# Patient Record
Sex: Male | Born: 1990 | Race: Black or African American | Hispanic: No | Marital: Single | Smoking: Former smoker
Health system: Southern US, Community
[De-identification: ages and names within clinical notes are randomized; demographics above are authoritative.]

## PROBLEM LIST (undated history)

## (undated) DIAGNOSIS — Z789 Other specified health status: Secondary | ICD-10-CM

## (undated) HISTORY — DX: Other specified health status: Z78.9

---

## 2016-09-30 ENCOUNTER — Encounter (HOSPITAL_BASED_OUTPATIENT_CLINIC_OR_DEPARTMENT_OTHER): Payer: Self-pay

## 2016-09-30 ENCOUNTER — Emergency Department (HOSPITAL_BASED_OUTPATIENT_CLINIC_OR_DEPARTMENT_OTHER)
Admission: EM | Admit: 2016-09-30 | Discharge: 2016-09-30 | Disposition: A | Discharge status: Home / Self Care | Payer: BLUE CROSS/BLUE SHIELD | Attending: Emergency Medicine | Admitting: Emergency Medicine

## 2016-09-30 DIAGNOSIS — R590 Localized enlarged lymph nodes: Secondary | ICD-10-CM | POA: Insufficient documentation

## 2016-09-30 DIAGNOSIS — R2243 Localized swelling, mass and lump, lower limb, bilateral: Secondary | ICD-10-CM | POA: Diagnosis present

## 2016-09-30 LAB — CBC WITH DIFFERENTIAL/PLATELET
Basophils Absolute: 0 10*3/uL (ref 0.0–0.1)
Basophils Relative: 0 %
EOS ABS: 0.7 10*3/uL (ref 0.0–0.7)
EOS PCT: 6 %
HCT: 41.3 % (ref 39.0–52.0)
HEMOGLOBIN: 14.4 g/dL (ref 13.0–17.0)
Lymphocytes Relative: 28 %
Lymphs Abs: 3 10*3/uL (ref 0.7–4.0)
MCH: 31.1 pg (ref 26.0–34.0)
MCHC: 34.9 g/dL (ref 30.0–36.0)
MCV: 89.2 fL (ref 78.0–100.0)
MONO ABS: 0.9 10*3/uL (ref 0.1–1.0)
Monocytes Relative: 8 %
NEUTROS ABS: 6.2 10*3/uL (ref 1.7–7.7)
Neutrophils Relative %: 58 %
Platelets: 303 10*3/uL (ref 150–400)
RBC: 4.63 MIL/uL (ref 4.22–5.81)
RDW: 12.4 % (ref 11.5–15.5)
WBC: 10.8 10*3/uL — ABNORMAL HIGH (ref 4.0–10.5)

## 2016-09-30 LAB — URINALYSIS, MICROSCOPIC (REFLEX)
RBC / HPF: NONE SEEN RBC/hpf (ref 0–5)
WBC, UA: NONE SEEN WBC/hpf (ref 0–5)

## 2016-09-30 LAB — URINALYSIS, ROUTINE W REFLEX MICROSCOPIC
Bilirubin Urine: NEGATIVE
GLUCOSE, UA: NEGATIVE mg/dL
Hgb urine dipstick: NEGATIVE
Ketones, ur: NEGATIVE mg/dL
LEUKOCYTES UA: NEGATIVE
Nitrite: NEGATIVE
PH: 8 (ref 5.0–8.0)
PROTEIN: 30 mg/dL — AB
Specific Gravity, Urine: 1.015 (ref 1.005–1.030)

## 2016-09-30 NOTE — ED Provider Notes (Signed)
[facility] Provider Note   CSN: [id number] Arrival date & time: 09/30/16  1929     History   Chief Complaint Chief Complaint  Patient presents with  . Groin Swelling    HPI Caydn Knightly is a 26 y.o. male.  The history is provided by the patient.   26 year old male with no pertinent past medical history presents the emergency department with 2 days of bilateral inguinal swelling. He reports feeling multiple nodules. Have been persistent since onset. They were initially mildly tender however pain has resolved. No alleviating or aggravating factors. He denies any history of sexually transmitted diseases. States that he had a recent checkup several months ago that was negative for HIV, syphilis, gonorrhea, chlamydia. States that he's monogamous with 1 partner for 3 years. Denies any recent dysuria, penile discharge, testicular pain. He does not partake in anal intercourse. He denies any lower extremity injuries. No flulike illnesses that he can remember in the past several months.  Denies any other physical complaints.   History reviewed. No pertinent past medical history.  There are no active problems to display for this patient.   History reviewed. No pertinent surgical history.     Home Medications    Prior to Admission medications   Not on File    Family History No family history on file.  Social History Social History  Substance Use Topics  . Smoking status: Never Smoker  . Smokeless tobacco: Never Used  . Alcohol use Yes     Comment: weekly     Allergies   Patient has no known allergies.   Review of Systems Review of Systems All other systems are reviewed and are negative for acute change except as noted in the HPI   Physical Exam Updated Vital Signs BP 130/84 (BP Location: Right Arm)   Pulse 67   Temp 98.3 F (36.8 C) (Oral)   Resp 16   Ht  (1.753 m)   Wt 69.9 kg (154 lb)   SpO2 100%   BMI 22.74 kg/m   Physical Exam   Constitutional: He is oriented to person, place, and time. He appears well-developed and well-nourished. No distress.  HENT:  Head: Normocephalic and atraumatic.  Nose: Nose normal.  Eyes: Pupils are equal, round, and reactive to light. Conjunctivae and EOM are normal. Right eye exhibits no discharge. Left eye exhibits no discharge. No scleral icterus.  Neck: Normal range of motion. Neck supple.  Cardiovascular: Normal rate and regular rhythm.  Exam reveals no gallop and no friction rub.   No murmur heard. Pulmonary/Chest: Effort normal and breath sounds normal. No stridor. No respiratory distress. He has no rales.  Abdominal: Soft. He exhibits no distension. There is no tenderness.  Musculoskeletal: He exhibits no edema or tenderness.  Lymphadenopathy:       Head (right side): No submental, no submandibular, no preauricular, no posterior auricular and no occipital adenopathy present.       Head (left side): No submental, no submandibular, no preauricular, no posterior auricular and no occipital adenopathy present.    He has no cervical adenopathy.    He has no axillary adenopathy.       Right axillary: No pectoral and no lateral adenopathy present.       Left axillary: No pectoral and no lateral adenopathy present.      Right: Inguinal adenopathy present. No supraclavicular adenopathy present.       Left: Inguinal adenopathy present. No supraclavicular adenopathy present.  Neurological: He  is alert and oriented to person, place, and time.  Skin: Skin is warm and dry. No rash noted. He is not diaphoretic. No erythema.  Psychiatric: He has a normal mood and affect.  Vitals reviewed.    ED Treatments / Results  Labs (all labs ordered are listed, but only abnormal results are displayed) Labs Reviewed  URINALYSIS, ROUTINE W REFLEX MICROSCOPIC - Abnormal; Notable for the following:       Result Value   Protein, ur 30 (*)    All other components within normal limits  URINALYSIS,  MICROSCOPIC (REFLEX) - Abnormal; Notable for the following:    Bacteria, UA RARE (*)    Squamous Epithelial / LPF 0-5 (*)    All other components within normal limits  CBC WITH DIFFERENTIAL/PLATELET - Abnormal; Notable for the following:    WBC 10.8 (*)    All other components within normal limits  HIV ANTIBODY (ROUTINE TESTING)  RPR  GC/CHLAMYDIA PROBE AMP ([place]) NOT AT [facility]    EKG  EKG Interpretation None       Radiology No results found.  Procedures Procedures (including critical care time)  Medications Ordered in ED Medications - No data to display   Initial Impression / Assessment and Plan / ED Course  I have reviewed the triage vital signs and the nursing notes.  Pertinent labs & imaging results that were available during my care of the patient were reviewed by me and considered in my medical decision making (see chart for details).     Lymphadenopathy of unknown etiology. GC/chlamydia, Trichomonas, HIV, RPR sent. CBC without abnormal cell counts.  Recommend he establish care with a primary care provider.   The patient is safe for discharge with strict return precautions.   Final Clinical Impressions(s) / ED Diagnoses   Final diagnoses:  Inguinal lymphadenopathy   Disposition: Discharge  Condition: Good  I have discussed the results, Dx and Tx plan with the patient who expressed understanding and agree(s) with the plan. Discharge instructions discussed at great length. The patient was given strict return precautions who verbalized understanding of the instructions. No further questions at time of discharge.    New Prescriptions   No medications on file    Follow Up: Primary care provider  In 2 weeks If symptoms do not improve or  worsen      Rikki Smestad, Amadeo Garnet, MD 09/30/16 2330

## 2016-09-30 NOTE — ED Triage Notes (Signed)
C/o "swollen glands" to right groin x 3 days-NAD-steady gait

## 2016-10-02 LAB — HIV ANTIBODY (ROUTINE TESTING W REFLEX): HIV SCREEN 4TH GENERATION: NONREACTIVE

## 2016-10-02 LAB — RPR: RPR: NONREACTIVE

## 2017-10-20 ENCOUNTER — Other Ambulatory Visit: Payer: Self-pay | Admitting: Gerontology

## 2017-10-20 ENCOUNTER — Ambulatory Visit (INDEPENDENT_AMBULATORY_CARE_PROVIDER_SITE_OTHER): Payer: Self-pay

## 2017-10-20 DIAGNOSIS — Z021 Encounter for pre-employment examination: Secondary | ICD-10-CM

## 2018-04-29 ENCOUNTER — Encounter (HOSPITAL_BASED_OUTPATIENT_CLINIC_OR_DEPARTMENT_OTHER): Payer: Self-pay | Admitting: Emergency Medicine

## 2018-04-29 ENCOUNTER — Other Ambulatory Visit: Payer: Self-pay

## 2018-04-29 ENCOUNTER — Emergency Department (HOSPITAL_BASED_OUTPATIENT_CLINIC_OR_DEPARTMENT_OTHER)
Admission: EM | Admit: 2018-04-29 | Discharge: 2018-04-29 | Disposition: A | Discharge status: Home / Self Care | Payer: BLUE CROSS/BLUE SHIELD | Attending: Emergency Medicine | Admitting: Emergency Medicine

## 2018-04-29 DIAGNOSIS — J029 Acute pharyngitis, unspecified: Secondary | ICD-10-CM

## 2018-04-29 DIAGNOSIS — J069 Acute upper respiratory infection, unspecified: Secondary | ICD-10-CM | POA: Diagnosis not present

## 2018-04-29 DIAGNOSIS — R070 Pain in throat: Secondary | ICD-10-CM | POA: Diagnosis present

## 2018-04-29 NOTE — ED Provider Notes (Signed)
Emergency Department Provider Note   I have reviewed the triage vital signs and the nursing notes.   HISTORY  Chief Complaint Sore Throat   HPI Alexander Callahan is a 28 y.o. male with no significant past medical history presents to the emergency department with sore throat and mild cough over the past several days.  Patient has not developed fever.  He is not having shaking chills or body aches.  No shortness of breath or chest pain. No known sick contacts.  He has been using over-the-counter medications and is feeling overall well.  He has been out of work for the last several days with symptoms.  He is requesting a note to return to work on Monday.  No radiation of symptoms or other modifying factors.  History reviewed. No pertinent past medical history.  There are no active problems to display for this patient.   History reviewed. No pertinent surgical history.  Allergies Patient has no known allergies.  No family history on file.  Social History Social History   Tobacco Use  . Smoking status: Never Smoker  . Smokeless tobacco: Never Used  Substance Use Topics  . Alcohol use: Yes    Comment: weekly  . Drug use: No    Review of Systems  Constitutional: No fever/chills Eyes: No visual changes. ENT: Positive sore throat. Cardiovascular: Denies chest pain. Respiratory: Denies shortness of breath. Positive cough.  Gastrointestinal: No abdominal pain.  No nausea, no vomiting.  No diarrhea.  No constipation. Genitourinary: Negative for dysuria. Musculoskeletal: Negative for back pain. Skin: Negative for rash. Neurological: Negative for headaches, focal weakness or numbness.  10-point ROS otherwise negative.  ____________________________________________   PHYSICAL EXAM:  VITAL SIGNS: ED Triage Vitals  Enc Vitals Group     BP 04/29/18 0719 (!) 148/83     Pulse Rate 04/29/18 0719 85     Resp 04/29/18 0719 16     Temp 04/29/18 0719 98.7 F (37.1 C)   Temp Source 04/29/18 0719 Oral     SpO2 04/29/18 0719 100 %     Weight 04/29/18 0719 145 lb (65.8 kg)     Height 04/29/18 0719 5\' 9"  (1.753 m)     Pain Score 04/29/18 0720 0   Constitutional: Alert and oriented. Well appearing and in no acute distress. Eyes: Conjunctivae are normal.  Head: Atraumatic. Nose: No congestion/rhinnorhea. Mouth/Throat: Mucous membranes are moist.  Oropharynx non-erythematous. No PTA. No tonsillar hypertrophy or exudate. Speaking in a clear voice.  Neck: No stridor.   Respiratory: Normal respiratory effort.   Gastrointestinal: No distention.  Musculoskeletal:  No gross deformities of extremities. Neurologic:  Normal speech and language.  Skin:  Skin is warm, dry and intact.  ____________________________________________   PROCEDURES  Procedure(s) performed:   Procedures  None  ____________________________________________   INITIAL IMPRESSION / ASSESSMENT AND PLAN / ED COURSE  Pertinent labs & imaging results that were available during my care of the patient were reviewed by me and considered in my medical decision making (see chart for details).   Patient presents to the emergency department for evaluation of sore throat and mild cough.  He has remained afebrile over the past several days.  He is requesting to return to work in 4 days.  Discussed that if he continues to have cough he will need to wear a mask while at work.  Also discussed that if he develops fever, temp > 100.4 F, and the next several days he will need to be off  of work for longer.  Discussed that he will need to remain fever free for 72 hours prior to returning to work.  Patient plans to purchase a thermometer today to monitor his temperature at home.  I did provide a work note saying that he could return to work on Monday under these conditions.  Discussed emergency department return precautions and self quarantine measures until he returns to work. Patient verbalizes understanding.     ____________________________________________  FINAL CLINICAL IMPRESSION(S) / ED DIAGNOSES  Final diagnoses:  Pharyngitis, unspecified etiology    Note:  This document was prepared using Dragon voice recognition software and may include unintentional dictation errors.  Alona Bene, MD Emergency Medicine    Long, Arlyss Repress, MD 04/29/18 [phone]

## 2018-04-29 NOTE — Discharge Instructions (Signed)

## 2018-04-29 NOTE — ED Triage Notes (Signed)
"  Scratchy throat" for a few days.  No other symptoms.  Has been out of work for a few days and needs a work note stating that he is OK to go back to work - ie. No Covid.

## 2018-04-29 NOTE — ED Notes (Signed)
ED Provider at bedside. 

## 2019-11-01 ENCOUNTER — Encounter (INDEPENDENT_AMBULATORY_CARE_PROVIDER_SITE_OTHER): Payer: Self-pay

## 2019-11-01 ENCOUNTER — Ambulatory Visit (INDEPENDENT_AMBULATORY_CARE_PROVIDER_SITE_OTHER): Payer: BC Managed Care – PPO | Admitting: Physician Assistant

## 2019-11-01 VITALS — BP 132/77 | HR 78 | Temp 97.8°F | Resp 16 | Ht 69.0 in | Wt 166.0 lb

## 2019-11-01 DIAGNOSIS — R102 Pelvic and perineal pain: Secondary | ICD-10-CM

## 2019-11-01 MED ORDER — CEFTRIAXONE SODIUM 500 MG IJ SOLR
500.0000 mg | Freq: Once | INTRAMUSCULAR | Status: AC
Start: 2019-11-01 — End: 2019-11-01
  Administered 2019-11-01: 17:00:00 500 mg via INTRAMUSCULAR

## 2019-11-01 MED ORDER — AZITHROMYCIN 500 MG PO TABS
1000.0000 mg | ORAL_TABLET | Freq: Once | ORAL | Status: AC
Start: 2019-11-01 — End: 2019-11-01
  Administered 2019-11-01: 17:00:00 1000 mg via ORAL
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
## 2019-11-01 NOTE — Progress Notes (Signed)
urine collected from patient by [facility]. This staff member prepared specimen, applied required charges and placed in courier box for lab courier to pick up.

## 2019-11-01 NOTE — Progress Notes (Signed)
-   MAR ACTION REPORT  (last 24 hrs)         Jacob Glover       Medication Name Action Time Action Site Route Rate Dose Reason Comments User     azithromycin (ZITHROMAX) tablet 1,000 mg 11/01/19 1727 Given  Oral  1,000 mg   [name]     cefTRIAXone (ROCEPHIN) injection 500 mg 11/01/19 1727 Given Right Ventrogluteal Intramuscular  500 mg   Chauncy Lean              Prepared medication. Reconstituted with lidocaine. Verified name and DOB. Explained procedure to patient and obtained verbal consent. Landmarks noted. Medication administered. Patient tolerated well. Chauncy Lean 5:29 PM

## 2019-11-01 NOTE — Patient Instructions (Signed)
If You Think You Have an STI (STD)  Treating a sexually transmitted infection (STI) early limits the problems it can cause and helps prevent its spread to others. An STI is also known as a sexually transmitted disease (STD). If you have an STI, get treated right away. Ask your partner to be tested, too. Then avoid sex until you’ve finished treatment and your healthcare provider says it’s OK to have sex again.   Follow your treatment plan  Treatment depends on the type of STI you have. Common treatments include injections and oral pills or liquids. Creams and gels can be applied to sores or warts caused by certain STIs. Follow the tips below:   · Get new treatment for each new STI.  · Don’t use old medicine, even for the same STI. Use medicines as directed.  · Don’t share medicine unless instructed to do so by your healthcare provider or clinic.  Talk to your partner  If you have an STI, it’s your duty to tell all your recent partners so they can be tested and treated. This is one important way to prevent the disease from being spread. Telling a partner that you have an STI can be hard. You may be embarrassed, angry, or afraid. It’s often unclear who had the STI first. So try not to place blame. Your healthcare provider may offer some advice on how to start.     Prevent future problems  Even after you’ve been treated, you can still be infected again. This is a common problem. It can happen if a partner passes the STI back to you. To prevent this, your partner or partners must be tested. He or she may also need treatment. After treatment, go to any scheduled follow-up visits. Then prevent future problems with safer sex. Limit your number of partners and always use a latex condom.   Diagnosing STIs  Your healthcare provider will take a health history and examine you. During your health history, you will be asked about your sex habits and health history. You may also be asked about drug use. Give honest answers.  Your healthcare provider will then check your body for signs of STIs. He or she also may do one or more of the following tests:   · Fluid may be swabbed from sores. Samples also may be taken from the vagina, penis, mouth, or rectum. The samples are then tested for STIs.  · Blood or urine samples may be taken. They are checked for viruses or bacteria that cause STIs.  · For women, cells from the cervix are checked for signs of cancer and the genital wart virus (human papillomavirus, or HPV). This is called a Pap smear, and is often now done along with HPV testing. If cell changes are found, or a high-risk type of HPV is found, a magnifying scope may be used to take a closer look (colposcopy). In men and women, a Pap smear may be done on the anus to check for HPV-linked cancer or precancerous changes. This is done by a healthcare provider gently swabbing cells from the lining of the anus, and then sending the sample to be looked at in the lab. If there are signs of abnormality, you may need more testing.  [employer] last reviewed this educational content on 02/13/2017    © 2000-2021 [employer]. All rights reserved. This information is not intended as a substitute for professional medical care. Always follow your healthcare professional's instructions.

## 2019-11-02 ENCOUNTER — Other Ambulatory Visit
Admission: RE | Admit: 2019-11-02 | Discharge: 2019-11-02 | Disposition: A | Discharge status: Home / Self Care | Payer: BC Managed Care – PPO | Source: Ambulatory Visit | Attending: Physician Assistant | Admitting: Physician Assistant

## 2019-11-02 DIAGNOSIS — R102 Pelvic and perineal pain: Secondary | ICD-10-CM

## 2019-11-02 LAB — VH STD AMPLIFIED DNA PROBE
Chlamydia trachomatis: NEGATIVE
Neisseria gonorrhoeae: NEGATIVE

## 2019-12-03 IMAGING — DX DG CHEST 1V
1 series · 1 of 1 positions shown · non-contrast
Comparison: None.

CLINICAL DATA: Pre-employment physical examination

EXAM:
CHEST  1 VIEW

[chest pa]
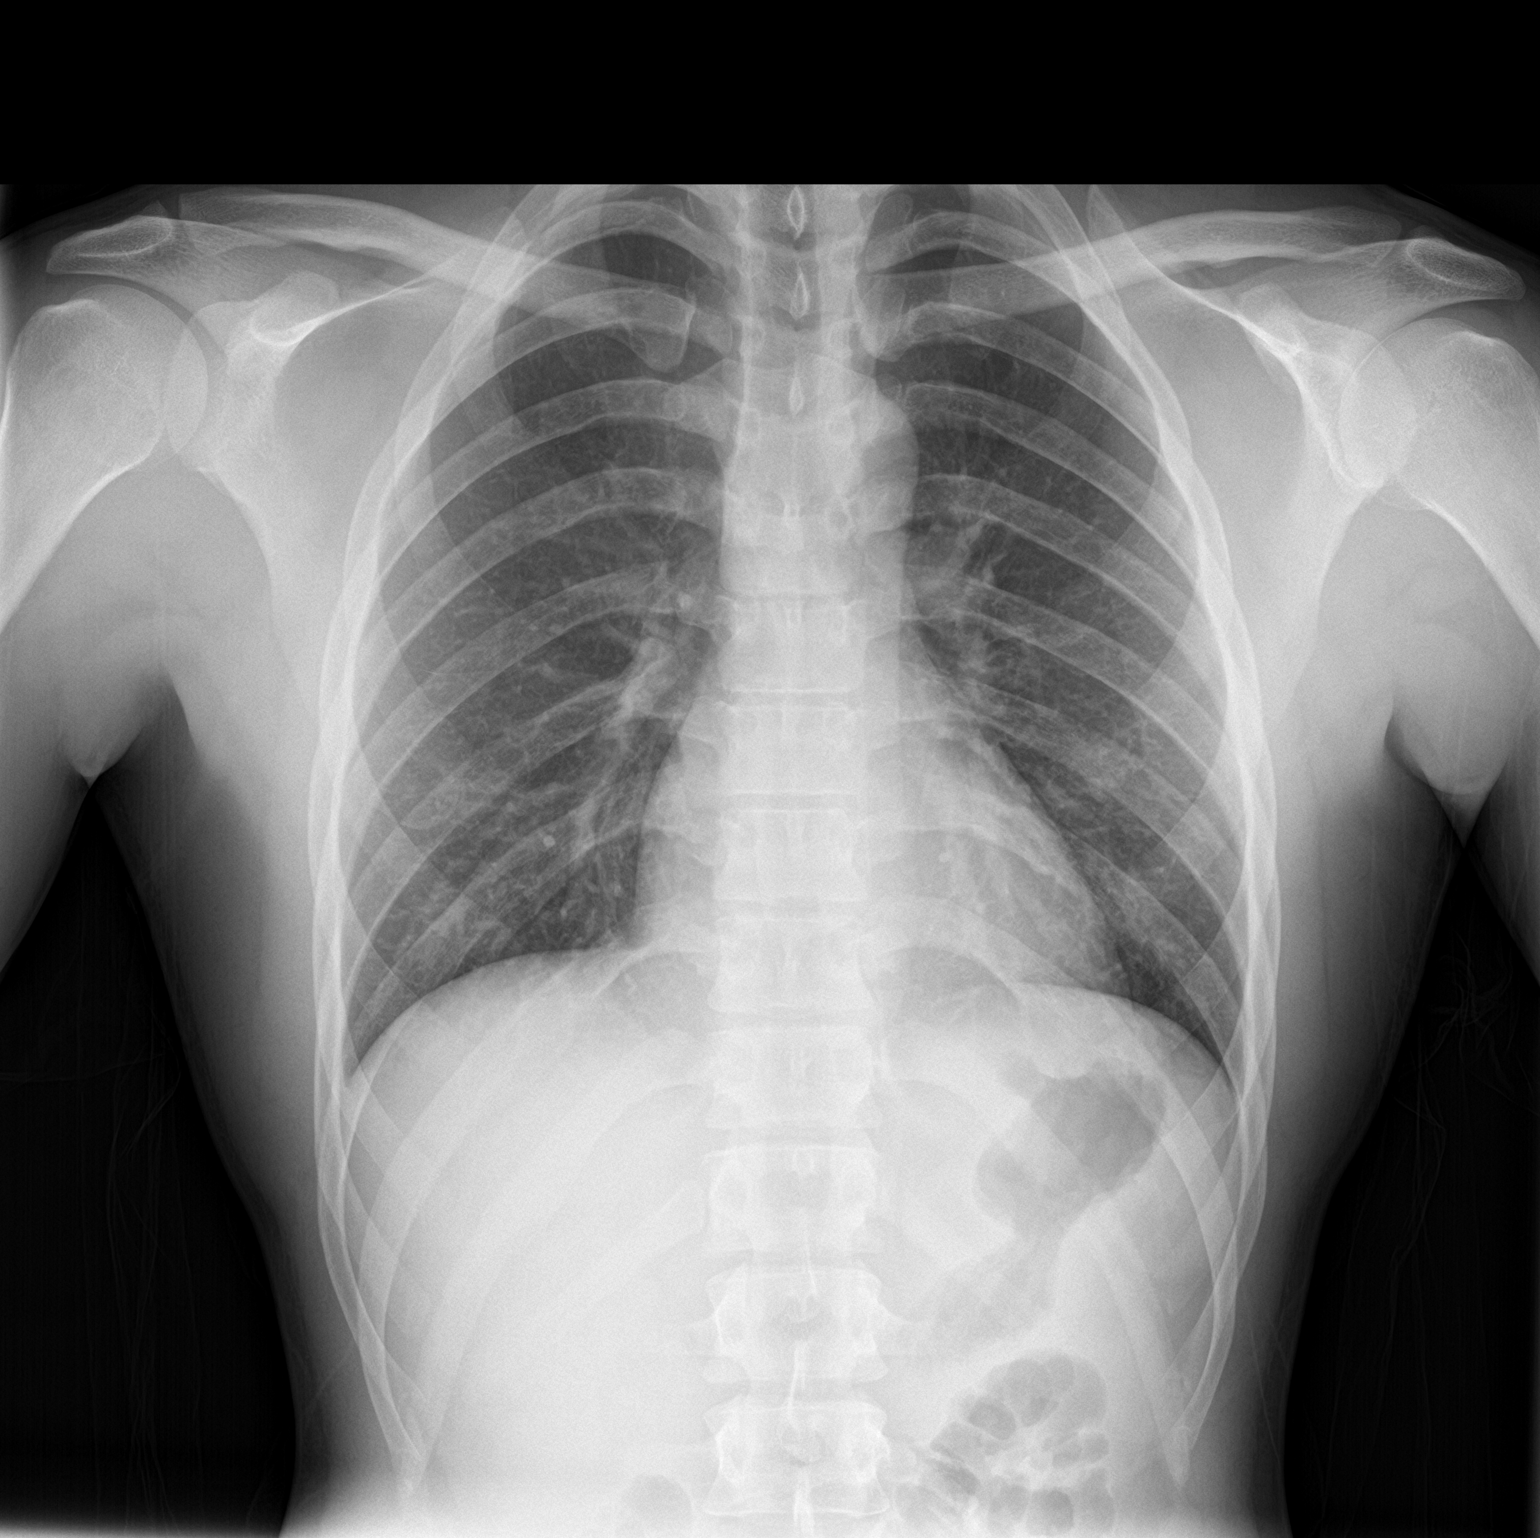

[1 of 1 positions shown; findings below may reference images not displayed]

FINDINGS: Lungs are clear. Heart size and pulmonary vascularity are normal. No
adenopathy. No bone lesions.
IMPRESSION: No abnormality noted.

## 2020-08-16 ENCOUNTER — Ambulatory Visit (INDEPENDENT_AMBULATORY_CARE_PROVIDER_SITE_OTHER): Payer: Self-pay | Admitting: Family

## 2020-08-16 ENCOUNTER — Encounter (INDEPENDENT_AMBULATORY_CARE_PROVIDER_SITE_OTHER): Payer: Self-pay | Admitting: Family

## 2020-08-16 VITALS — BP 141/81 | HR 82 | Temp 98.3°F | Ht 69.0 in | Wt 160.5 lb

## 2020-08-16 DIAGNOSIS — M542 Cervicalgia: Secondary | ICD-10-CM

## 2020-08-16 DIAGNOSIS — M5412 Radiculopathy, cervical region: Secondary | ICD-10-CM

## 2020-08-16 DIAGNOSIS — M549 Dorsalgia, unspecified: Secondary | ICD-10-CM

## 2020-08-16 DIAGNOSIS — Z0000 Encounter for general adult medical examination without abnormal findings: Secondary | ICD-10-CM
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
## 2020-08-30 ENCOUNTER — Encounter (INDEPENDENT_AMBULATORY_CARE_PROVIDER_SITE_OTHER): Payer: BC Managed Care – PPO | Admitting: Family

## 2020-09-03 ENCOUNTER — Ambulatory Visit: Payer: Self-pay | Attending: Family

## 2020-09-03 ENCOUNTER — Other Ambulatory Visit: Payer: Self-pay

## 2020-09-03 DIAGNOSIS — Z0000 Encounter for general adult medical examination without abnormal findings: Secondary | ICD-10-CM | POA: Insufficient documentation

## 2020-09-03 LAB — CBC WITH DIFF
BASOPHIL #: <0.1 10*3/uL (ref <=0.20)
BASOPHIL %: 1 %
EOSINOPHIL #: 0.38 10*3/uL (ref <=0.50)
EOSINOPHIL %: 4 %
HCT: 48.3 % (ref 38.9–52.0)
HGB: 15.6 g/dL (ref 13.4–17.5)
IMMATURE GRANULOCYTE #: <0.1 10*3/uL (ref <0.10)
IMMATURE GRANULOCYTE %: 0 % (ref 0–1)
LYMPHOCYTE #: 3.26 10*3/uL (ref 1.00–4.80)
LYMPHOCYTE %: 34 %
MCH: 29.1 pg (ref 26.0–32.0)
MCHC: 32.3 g/dL (ref 31.0–35.5)
MCV: 90.1 fL (ref 78.0–100.0)
MONOCYTE #: 0.61 10*3/uL (ref 0.20–1.10)
MONOCYTE %: 6 %
MPV: 10 fL (ref 8.7–12.5)
NEUTROPHIL #: 5.2 10*3/uL (ref 1.50–7.70)
NEUTROPHIL %: 55 %
PLATELETS: 379 10*3/uL (ref 150–400)
RBC: 5.36 10*6/uL (ref 4.50–6.10)
RDW-CV: 11.7 % (ref 11.5–15.5)
WBC: 9.5 10*3/uL (ref 3.7–11.0)

## 2020-09-03 LAB — URINALYSIS, MICROSCOPIC

## 2020-09-03 LAB — LIPID PANEL
CHOL/HDL RATIO: 5.6
CHOLESTEROL: 239 mg/dL — ABNORMAL HIGH (ref 100–200)
HDL CHOL: 43 mg/dL — ABNORMAL LOW (ref >=50)
LDL CALC: 175 mg/dL — ABNORMAL HIGH (ref <100)
NON-HDL: 196 mg/dL — ABNORMAL HIGH (ref <=190)
TRIGLYCERIDES: 106 mg/dL (ref <150)
VLDL CALC: 21 mg/dL (ref <30)

## 2020-09-03 LAB — URINALYSIS, MACROSCOPIC
BILIRUBIN: NEGATIVE mg/dL
GLUCOSE: NEGATIVE mg/dL
KETONES: NEGATIVE mg/dL
LEUKOCYTES: NEGATIVE WBCs/uL
NITRITE: NEGATIVE
PH: 6 (ref <8.0)
PROTEIN: 100 mg/dL — AB
SPECIFIC GRAVITY: 1.019 (ref <1.022)
UROBILINOGEN: <2 mg/dL (ref <=2.0)

## 2020-09-03 LAB — COMPREHENSIVE METABOLIC PANEL, NON-FASTING
ALBUMIN: 4.3 g/dL (ref 3.5–5.0)
ALKALINE PHOSPHATASE: 65 U/L (ref 45–115)
ALT (SGPT): 31 U/L (ref 10–55)
ANION GAP: 9 mmol/L (ref 4–13)
AST (SGOT): 26 U/L (ref 8–45)
BILIRUBIN TOTAL: 0.3 mg/dL (ref 0.3–1.3)
BUN/CREA RATIO: 15 (ref 6–22)
BUN: 21 mg/dL (ref 8–25)
CALCIUM: 9.9 mg/dL (ref 8.5–10.0)
CHLORIDE: 105 mmol/L (ref 96–111)
CO2 TOTAL: 27 mmol/L (ref 22–30)
CREATININE: 1.42 mg/dL — ABNORMAL HIGH (ref 0.75–1.35)
ESTIMATED GFR: 68 mL/min/BSA (ref >=60)
GLUCOSE: 89 mg/dL (ref 65–125)
POTASSIUM: 4.2 mmol/L (ref 3.5–5.1)
PROTEIN TOTAL: 7.8 g/dL (ref 6.4–8.3)
SODIUM: 141 mmol/L (ref 136–145)

## 2020-09-03 LAB — THYROID STIMULATING HORMONE (SENSITIVE TSH): TSH: 1.878 u[IU]/mL (ref 0.430–3.550)

## 2020-09-04 LAB — HGA1C (HEMOGLOBIN A1C WITH EST AVG GLUCOSE)
ESTIMATED AVERAGE GLUCOSE: 114 mg/dL
HEMOGLOBIN A1C: 5.6 % (ref 4.0–5.6)

## 2020-09-13 ENCOUNTER — Ambulatory Visit (INDEPENDENT_AMBULATORY_CARE_PROVIDER_SITE_OTHER): Payer: BC Managed Care – PPO | Admitting: Family

## 2020-09-13 ENCOUNTER — Encounter (INDEPENDENT_AMBULATORY_CARE_PROVIDER_SITE_OTHER): Payer: Self-pay | Admitting: Family

## 2020-09-13 VITALS — BP 121/85 | HR 65 | Temp 97.7°F | Ht 69.0 in | Wt 158.0 lb

## 2020-09-13 DIAGNOSIS — R0602 Shortness of breath: Secondary | ICD-10-CM

## 2020-09-13 DIAGNOSIS — M549 Dorsalgia, unspecified: Secondary | ICD-10-CM

## 2020-09-13 DIAGNOSIS — M542 Cervicalgia: Secondary | ICD-10-CM

## 2020-09-13 MED ORDER — TIZANIDINE 4 MG CAPSULE
4.0000 mg | ORAL_CAPSULE | Freq: Three times a day (TID) | ORAL | 0 refills | Status: DC
Start: 2020-09-13 — End: 2020-12-12
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
## 2020-09-13 NOTE — Progress Notes (Cosign Needed)
[facility]  [address]  [place] [address]  Phone: [phone]  Fax: [phone]    Encounter Date: 09/13/2020    Patient ID:  Jared Pierce  [id number]    DOB: [birth year]  Age: 30 y.o. male    Subjective:     Chief Complaint   Patient presents with    Follow Up    Lab Results     HPI   30 Y/O with ongoing neck pain seen today for f/u to review labs   CT scan of the neck is still pending , He states he still has not heard back from [place]. Will follow   Current Outpatient Medications   Medication Sig    Tizanidine (ZANAFLEX) 4 mg Oral Capsule Take 1 Capsule (4 mg total) by mouth Three times a day     No Known Allergies  History reviewed. No pertinent past medical history.            Family Medical History:    None         Social History     Tobacco Use    Smoking status: Never Smoker    Smokeless tobacco: Never Used   Substance Use Topics    Alcohol use: Yes     Comment: occ    Drug use: Never       Review of Systems   Constitutional: Negative for chills.   HENT: Negative for congestion.    Respiratory: Negative for cough and shortness of breath.    Cardiovascular: Negative for chest pain and palpitations.   Gastrointestinal: Negative for abdominal pain, constipation, nausea and vomiting.   Genitourinary: Negative for dysuria.   Musculoskeletal: Positive for back pain, neck pain and neck stiffness.   Neurological: Negative for headaches.   Psychiatric/Behavioral: Negative for confusion.     Objective:   Vitals: BP 121/85    Pulse 65    Temp 36.5 C (97.7 F)    Ht 1.753 m (5\' 9" )    Wt 71.7 kg (158 lb)    SpO2 98%    BMI 23.33 kg/m         Physical Exam  Vitals and nursing note reviewed.   Constitutional:       Appearance: Normal appearance.   HENT:      Head: Normocephalic and atraumatic.      Nose: Nose normal.      Mouth/Throat:      Mouth: Mucous membranes are moist.   Cardiovascular:      Rate and Rhythm: Normal rate and regular rhythm.       Pulses: Normal pulses.      Heart sounds: Normal heart sounds.   Pulmonary:      Effort: Pulmonary effort is normal.      Breath sounds: Normal breath sounds.   Abdominal:      General: Bowel sounds are normal.      Palpations: Abdomen is soft.   Musculoskeletal:      Cervical back: Rigidity and tenderness present.   Skin:     General: Skin is warm and dry.      Capillary Refill: Capillary refill takes less than 2 seconds.   Neurological:      Mental Status: He is alert and oriented to person, place, and time.   Psychiatric:         Mood and Affect: Mood normal.         Behavior: Behavior normal.  Thought Content: Thought content normal.         Judgment: Judgment normal.         Assessment & Plan:     ENCOUNTER DIAGNOSES     ICD-10-CM   1. Neck pain  M54.2   2. Upper back pain  M54.9   3. SOB (shortness of breath)  R06.02       Orders Placed This Encounter    XR CHEST PA AND LATERAL    Refer to External Provider    Tizanidine (ZANAFLEX) 4 mg Oral Capsule     Reviewed most recent labs, unremarkable.   Continue with current plan of care  Robaxin as prescribed  Will F/U on CT scan of the neck   Encouraged Healthy lifestyle with diet and exercise  Encouraged fruits and vegetables and whole grains as tolerated   Call with questions and concerns      Tali Coster, APRN,NP-C

## 2020-12-04 ENCOUNTER — Ambulatory Visit (INDEPENDENT_AMBULATORY_CARE_PROVIDER_SITE_OTHER): Payer: BC Managed Care – PPO | Admitting: Family

## 2020-12-04 ENCOUNTER — Encounter (INDEPENDENT_AMBULATORY_CARE_PROVIDER_SITE_OTHER): Payer: Self-pay | Admitting: Family

## 2020-12-04 VITALS — BP 145/85 | HR 78 | Temp 98.1°F | Ht 69.0 in | Wt 166.0 lb

## 2020-12-04 DIAGNOSIS — R079 Chest pain, unspecified: Secondary | ICD-10-CM

## 2020-12-04 DIAGNOSIS — R0602 Shortness of breath: Secondary | ICD-10-CM

## 2020-12-04 NOTE — Progress Notes (Cosign Needed)
[facility]  [address]  [place] [address]  Phone: [phone]  Fax: [phone]    Encounter Date: 12/04/2020    Patient ID:  Jared Pierce  [id number]    DOB: Jan 27, 1990  Age: 30 y.o. male    Subjective:     Chief Complaint   Patient presents with   . Chest Pain      Patient is having chest pain and it has been ongoing for sometime now.  Does not hurt to touch feels like tightness and that makes it hard for him to breathe.  The pain is so bad that it folds him in half.  Sometimes the patient will get pain down his left arm and up in his neck.     HPI   30 y/o seen today as a walk in due to chest pain , ongoing for a while, describes the pain as tightness, sometimes radiates down his left arm and neck  He reports pain is at times debilitating     Current Outpatient Medications   Medication Sig   . Tizanidine (ZANAFLEX) 4 mg Oral Capsule Take 1 Capsule (4 mg total) by mouth Three times a day     No Known Allergies  History reviewed. No pertinent past medical history.            Family Medical History:    None         Social History     Tobacco Use   . Smoking status: Never   . Smokeless tobacco: Never   Substance Use Topics   . Alcohol use: Yes     Comment: occ   . Drug use: Never       Review of Systems   Constitutional: Negative for chills.   HENT: Negative for congestion.    Respiratory: Positive for chest tightness. Negative for cough and shortness of breath.    Cardiovascular: Negative for chest pain and palpitations.   Gastrointestinal: Negative for abdominal pain, constipation, nausea and vomiting.   Genitourinary: Negative for dysuria.   Musculoskeletal: Positive for back pain, neck pain and neck stiffness.   Neurological: Negative for headaches.   Psychiatric/Behavioral: Negative for confusion.     Objective:   Vitals: BP (!) 145/85   Pulse 78   Temp 36.7 C (98.1 F)   Ht 1.753 m (5\' 9" )   Wt 75.3 kg (166 lb)   SpO2 98%   BMI 24.51 kg/m          Physical Exam  Vitals and nursing note reviewed.   Constitutional:       Appearance: Normal appearance.   HENT:      Head: Normocephalic and atraumatic.      Nose: Nose normal.      Mouth/Throat:      Mouth: Mucous membranes are moist.   Cardiovascular:      Rate and Rhythm: Normal rate and regular rhythm.      Pulses: Normal pulses.      Heart sounds: Normal heart sounds.   Pulmonary:      Effort: Pulmonary effort is normal.      Breath sounds: Normal breath sounds.   Abdominal:      General: Bowel sounds are normal.      Palpations: Abdomen is soft.   Musculoskeletal:      Cervical back: Rigidity and tenderness present.   Skin:     General: Skin is warm and dry.  Capillary Refill: Capillary refill takes less than 2 seconds.   Neurological:      Mental Status: He is alert and oriented to person, place, and time.   Psychiatric:         Mood and Affect: Mood normal.         Behavior: Behavior normal.         Thought Content: Thought content normal.         Judgment: Judgment normal.         Assessment & Plan:     ENCOUNTER DIAGNOSES     ICD-10-CM   1. Chest pain, unspecified type  R07.9       EKG unremarkable  Chest x-ray ordered  Heart monitor given   Monitor blood pressure   Will refer to Cardiology, he himself is not high risk, however he reports stron family h/o of cardiac events   Advised on strict return precautions  F/U as scheduled     Aqil Goetting, APRN,NP-C

## 2020-12-11 ENCOUNTER — Ambulatory Visit (INDEPENDENT_AMBULATORY_CARE_PROVIDER_SITE_OTHER): Payer: BC Managed Care – PPO | Admitting: Family

## 2020-12-11 ENCOUNTER — Encounter (INDEPENDENT_AMBULATORY_CARE_PROVIDER_SITE_OTHER): Payer: Self-pay | Admitting: Family

## 2020-12-11 VITALS — BP 148/95 | HR 86 | Temp 98.7°F | Ht 69.0 in | Wt 166.5 lb

## 2020-12-11 DIAGNOSIS — M5412 Radiculopathy, cervical region: Secondary | ICD-10-CM

## 2020-12-11 DIAGNOSIS — M549 Dorsalgia, unspecified: Secondary | ICD-10-CM

## 2020-12-11 DIAGNOSIS — M542 Cervicalgia: Secondary | ICD-10-CM
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
## 2020-12-12 ENCOUNTER — Other Ambulatory Visit (INDEPENDENT_AMBULATORY_CARE_PROVIDER_SITE_OTHER): Payer: Self-pay | Admitting: Family Medicine

## 2020-12-12 MED ORDER — TIZANIDINE 4 MG CAPSULE
4.0000 mg | ORAL_CAPSULE | Freq: Three times a day (TID) | ORAL | 0 refills | Status: AC
Start: 2020-12-12 — End: ?

## 2020-12-13 ENCOUNTER — Encounter (INDEPENDENT_AMBULATORY_CARE_PROVIDER_SITE_OTHER): Payer: Self-pay | Admitting: Family

## 2020-12-13 ENCOUNTER — Encounter (INDEPENDENT_AMBULATORY_CARE_PROVIDER_SITE_OTHER): Payer: BC Managed Care – PPO | Admitting: Family

## 2021-01-08 ENCOUNTER — Ambulatory Visit (INDEPENDENT_AMBULATORY_CARE_PROVIDER_SITE_OTHER): Payer: BC Managed Care – PPO | Admitting: Family

## 2021-01-08 ENCOUNTER — Encounter (INDEPENDENT_AMBULATORY_CARE_PROVIDER_SITE_OTHER): Payer: Self-pay | Admitting: Family

## 2021-01-08 VITALS — BP 157/96 | HR 104 | Temp 98.6°F | Ht 69.0 in | Wt 163.5 lb

## 2021-01-08 DIAGNOSIS — M5412 Radiculopathy, cervical region: Secondary | ICD-10-CM

## 2021-01-08 DIAGNOSIS — M549 Dorsalgia, unspecified: Secondary | ICD-10-CM

## 2021-01-08 DIAGNOSIS — M542 Cervicalgia: Secondary | ICD-10-CM
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
## 2021-01-22 ENCOUNTER — Encounter (INDEPENDENT_AMBULATORY_CARE_PROVIDER_SITE_OTHER): Payer: Self-pay | Admitting: Family
